# Patient Record
Sex: Female | Born: 1980 | Race: White | Hispanic: Yes | Marital: Married | Smoking: Never smoker
Health system: Southern US, Community
[De-identification: ages and names within clinical notes are randomized; demographics above are authoritative.]

## PROBLEM LIST (undated history)

## (undated) DIAGNOSIS — D179 Benign lipomatous neoplasm, unspecified: Secondary | ICD-10-CM

## (undated) DIAGNOSIS — I10 Essential (primary) hypertension: Secondary | ICD-10-CM

## (undated) DIAGNOSIS — R519 Headache, unspecified: Secondary | ICD-10-CM

## (undated) HISTORY — DX: Headache, unspecified: R51.9

---

## 2014-06-03 ENCOUNTER — Other Ambulatory Visit: Payer: Self-pay | Admitting: Family Medicine

## 2014-06-03 DIAGNOSIS — Z1239 Encounter for other screening for malignant neoplasm of breast: Secondary | ICD-10-CM

## 2016-02-26 ENCOUNTER — Other Ambulatory Visit: Payer: Self-pay | Admitting: Family Medicine

## 2016-02-26 ENCOUNTER — Other Ambulatory Visit (HOSPITAL_COMMUNITY)
Admission: RE | Admit: 2016-02-26 | Discharge: 2016-02-26 | Disposition: A | Discharge status: Home / Self Care | Payer: BLUE CROSS/BLUE SHIELD | Source: Ambulatory Visit | Attending: Family Medicine | Admitting: Family Medicine

## 2016-02-26 DIAGNOSIS — Z01411 Encounter for gynecological examination (general) (routine) with abnormal findings: Secondary | ICD-10-CM | POA: Insufficient documentation

## 2016-03-01 LAB — CYTOLOGY - PAP

## 2016-03-03 ENCOUNTER — Other Ambulatory Visit: Payer: Self-pay | Admitting: Family Medicine

## 2016-03-03 DIAGNOSIS — N644 Mastodynia: Secondary | ICD-10-CM

## 2016-03-10 ENCOUNTER — Other Ambulatory Visit: Payer: Self-pay | Admitting: Family Medicine

## 2016-03-10 DIAGNOSIS — N644 Mastodynia: Secondary | ICD-10-CM

## 2016-03-12 ENCOUNTER — Ambulatory Visit
Admission: RE | Admit: 2016-03-12 | Discharge: 2016-03-12 | Disposition: A | Discharge status: Home / Self Care | Payer: BLUE CROSS/BLUE SHIELD | Source: Ambulatory Visit | Attending: Family Medicine | Admitting: Family Medicine

## 2016-03-12 DIAGNOSIS — N644 Mastodynia: Secondary | ICD-10-CM

## 2019-03-23 ENCOUNTER — Other Ambulatory Visit: Payer: Self-pay | Admitting: Gastroenterology

## 2019-03-23 ENCOUNTER — Ambulatory Visit
Admission: RE | Admit: 2019-03-23 | Discharge: 2019-03-23 | Disposition: A | Discharge status: Home / Self Care | Payer: Medicaid Other | Source: Ambulatory Visit | Attending: Gastroenterology | Admitting: Gastroenterology

## 2019-03-23 DIAGNOSIS — R140 Abdominal distension (gaseous): Secondary | ICD-10-CM

## 2020-03-26 ENCOUNTER — Other Ambulatory Visit: Payer: Self-pay | Admitting: Family Medicine

## 2020-03-26 DIAGNOSIS — N644 Mastodynia: Secondary | ICD-10-CM

## 2020-03-28 ENCOUNTER — Telehealth: Payer: Self-pay | Admitting: Dermatology

## 2020-03-28 NOTE — Telephone Encounter (Signed)
Referral from [facility], Dr. Kateri Plummer, told her January and she wants you to contact practice + see if they can find something sooner

## 2020-04-10 ENCOUNTER — Other Ambulatory Visit: Payer: Self-pay | Admitting: Family Medicine

## 2020-04-10 ENCOUNTER — Ambulatory Visit
Admission: RE | Admit: 2020-04-10 | Discharge: 2020-04-10 | Disposition: A | Discharge status: Home / Self Care | Payer: Medicaid Other | Source: Ambulatory Visit | Attending: Family Medicine | Admitting: Family Medicine

## 2020-04-10 DIAGNOSIS — N644 Mastodynia: Secondary | ICD-10-CM

## 2020-04-10 DIAGNOSIS — N6320 Unspecified lump in the left breast, unspecified quadrant: Secondary | ICD-10-CM

## 2020-10-13 ENCOUNTER — Other Ambulatory Visit: Payer: Medicaid Other

## 2020-11-21 ENCOUNTER — Other Ambulatory Visit: Payer: Self-pay | Admitting: Family Medicine

## 2020-11-21 ENCOUNTER — Other Ambulatory Visit: Payer: Self-pay

## 2020-11-21 ENCOUNTER — Ambulatory Visit
Admission: RE | Admit: 2020-11-21 | Discharge: 2020-11-21 | Disposition: A | Discharge status: Home / Self Care | Payer: Medicaid Other | Source: Ambulatory Visit | Attending: Family Medicine | Admitting: Family Medicine

## 2020-11-21 DIAGNOSIS — N6320 Unspecified lump in the left breast, unspecified quadrant: Secondary | ICD-10-CM

## 2020-11-21 DIAGNOSIS — N6321 Unspecified lump in the left breast, upper outer quadrant: Secondary | ICD-10-CM | POA: Diagnosis not present

## 2020-11-25 ENCOUNTER — Other Ambulatory Visit: Payer: Self-pay

## 2020-11-25 ENCOUNTER — Ambulatory Visit
Admission: RE | Admit: 2020-11-25 | Discharge: 2020-11-25 | Disposition: A | Discharge status: Home / Self Care | Payer: BC Managed Care – PPO | Source: Ambulatory Visit | Attending: Family Medicine | Admitting: Family Medicine

## 2020-11-25 ENCOUNTER — Other Ambulatory Visit: Payer: BC Managed Care – PPO

## 2020-11-25 DIAGNOSIS — D242 Benign neoplasm of left breast: Secondary | ICD-10-CM | POA: Diagnosis not present

## 2020-11-25 DIAGNOSIS — N6320 Unspecified lump in the left breast, unspecified quadrant: Secondary | ICD-10-CM

## 2020-11-25 DIAGNOSIS — N6321 Unspecified lump in the left breast, upper outer quadrant: Secondary | ICD-10-CM | POA: Diagnosis not present

## 2020-12-29 ENCOUNTER — Other Ambulatory Visit: Payer: Self-pay | Admitting: Family Medicine

## 2020-12-29 DIAGNOSIS — Z1231 Encounter for screening mammogram for malignant neoplasm of breast: Secondary | ICD-10-CM

## 2021-04-13 ENCOUNTER — Ambulatory Visit: Payer: BC Managed Care – PPO

## 2021-06-05 ENCOUNTER — Ambulatory Visit
Admission: RE | Admit: 2021-06-05 | Discharge: 2021-06-05 | Disposition: A | Discharge status: Home / Self Care | Payer: Medicaid Other | Source: Ambulatory Visit | Attending: Family Medicine | Admitting: Family Medicine

## 2021-06-05 ENCOUNTER — Other Ambulatory Visit: Payer: Self-pay

## 2021-06-05 DIAGNOSIS — Z1231 Encounter for screening mammogram for malignant neoplasm of breast: Secondary | ICD-10-CM

## 2021-12-07 DIAGNOSIS — Z30431 Encounter for routine checking of intrauterine contraceptive device: Secondary | ICD-10-CM | POA: Diagnosis not present

## 2022-04-05 DIAGNOSIS — Z01419 Encounter for gynecological examination (general) (routine) without abnormal findings: Secondary | ICD-10-CM | POA: Diagnosis not present

## 2022-04-05 DIAGNOSIS — Z6837 Body mass index (BMI) 37.0-37.9, adult: Secondary | ICD-10-CM | POA: Diagnosis not present

## 2022-04-05 DIAGNOSIS — Z1151 Encounter for screening for human papillomavirus (HPV): Secondary | ICD-10-CM | POA: Diagnosis not present

## 2022-04-05 DIAGNOSIS — Z124 Encounter for screening for malignant neoplasm of cervix: Secondary | ICD-10-CM | POA: Diagnosis not present

## 2022-06-08 IMAGING — MG MM DIGITAL SCREENING BILAT W/ TOMO AND CAD
8 series · 8 of 24 positions shown · non-contrast
Comparison: Previous exam(s).

CLINICAL DATA: Screening.

EXAM:
DIGITAL SCREENING BILATERAL MAMMOGRAM WITH TOMOSYNTHESIS AND CAD
TECHNIQUE: Bilateral screening digital craniocaudal and mediolateral oblique
mammograms were obtained. Bilateral screening digital breast
tomosynthesis was performed. The images were evaluated with
computer-aided detection.

[R CC synth-2D]
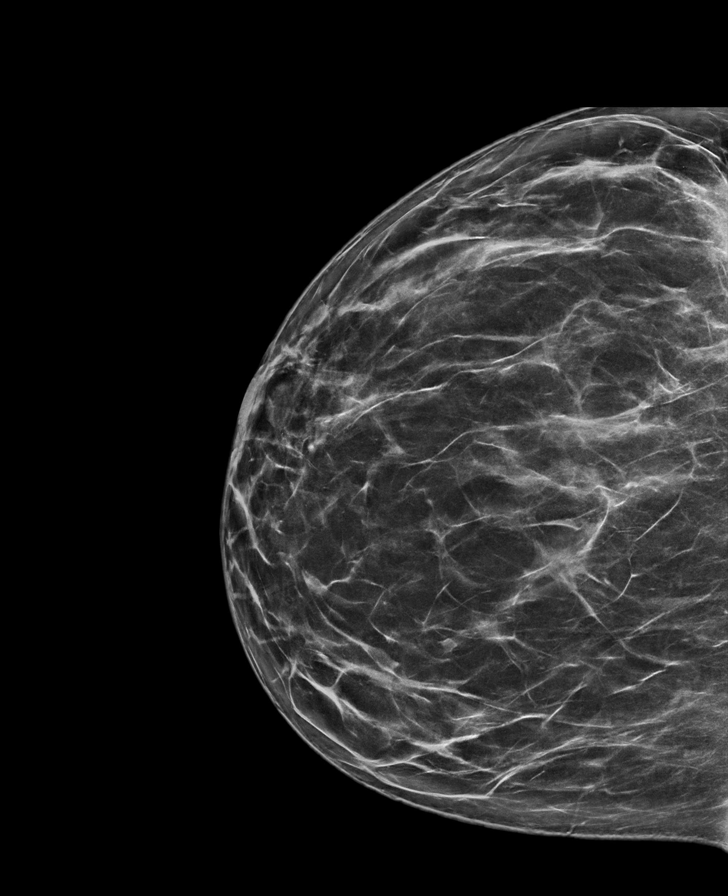

[L CC synth-2D]
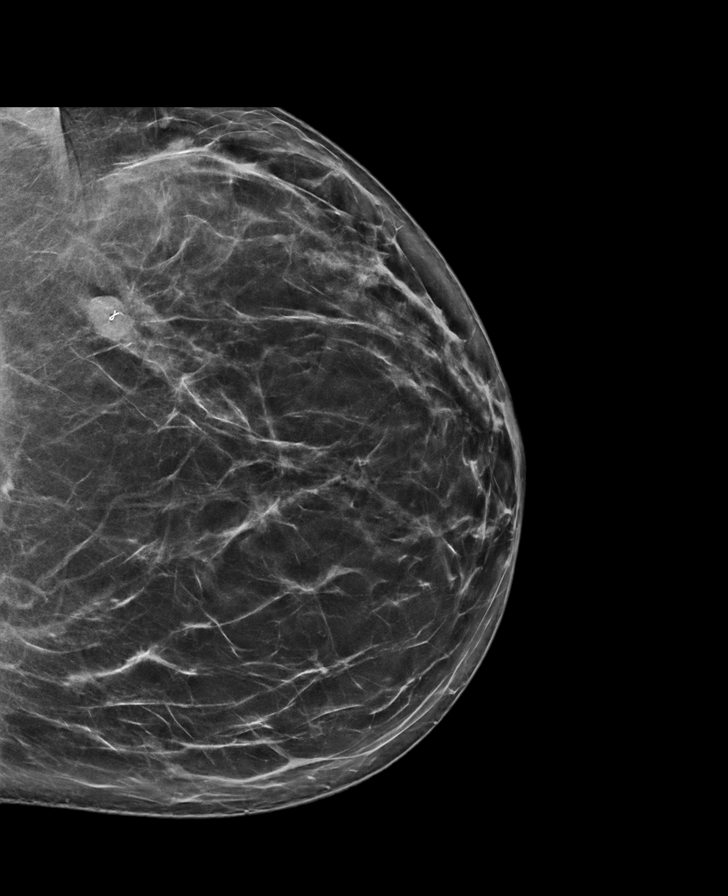

[L MLO synth-2D]
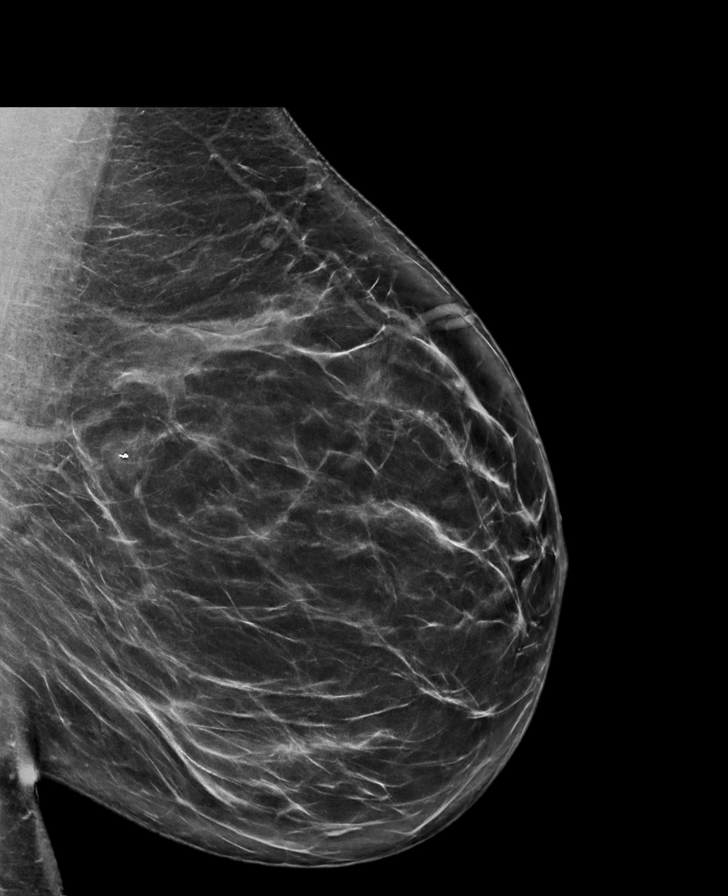

[R MLO synth-2D]
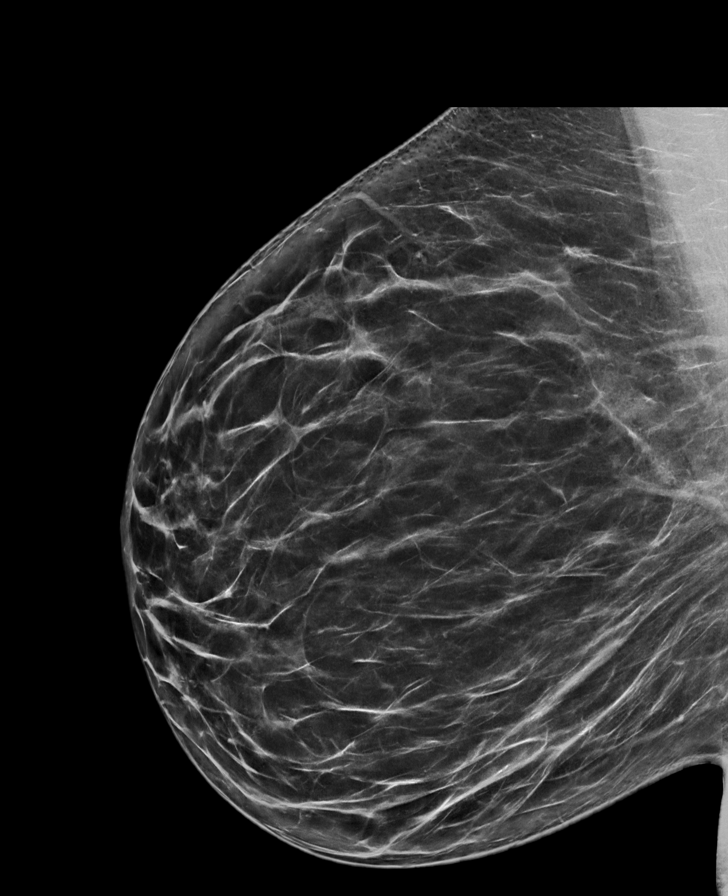

[R CC tomo · tomo slice 43/84.0]
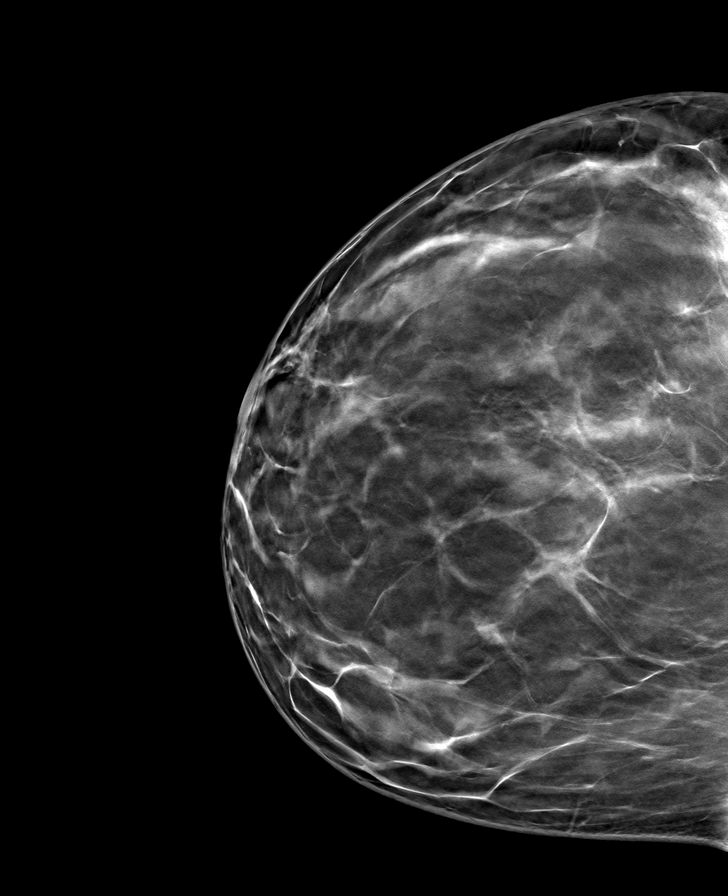

[L CC tomo · tomo slice 46/91.0]
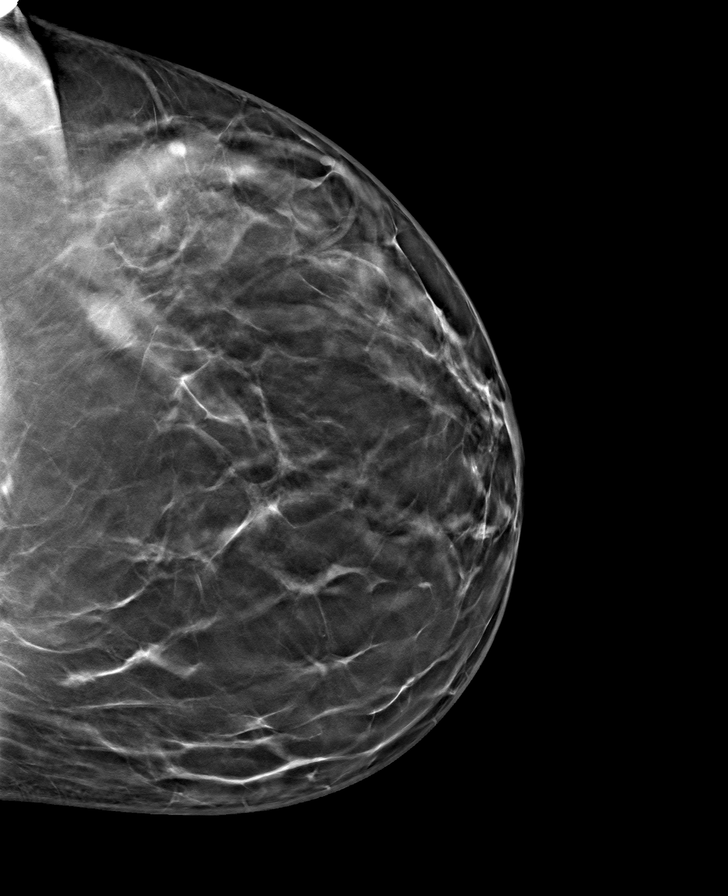

[R MLO tomo · tomo slice 47/94.0]
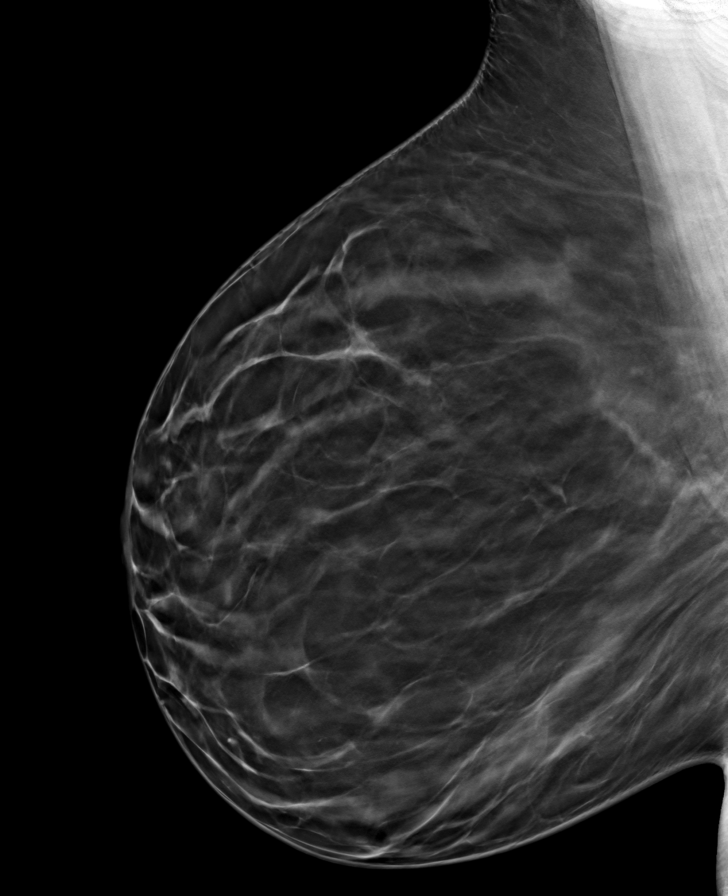

[L MLO tomo · tomo slice 49/97.0]
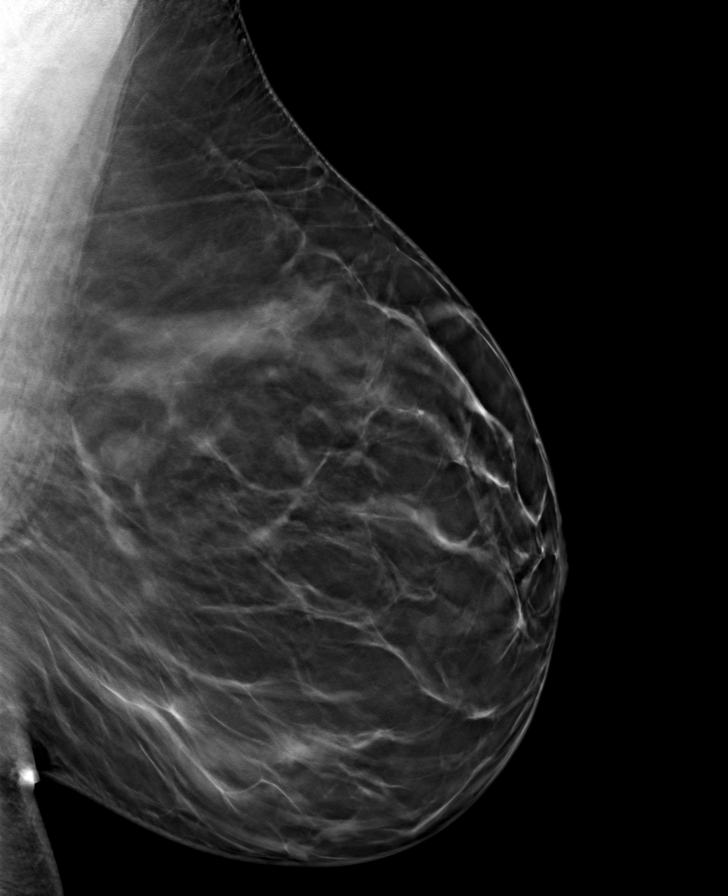

[8 of 24 positions shown; findings below may reference images not displayed]

ACR Breast Density Category b: There are scattered areas of
fibroglandular density.
FINDINGS: There are no findings suspicious for malignancy.
IMPRESSION: No mammographic evidence of malignancy. A result letter of this
screening mammogram will be mailed directly to the patient.

RECOMMENDATION:
Screening mammogram in one year. (Code:51-O-LD2)

BI-RADS CATEGORY  1: Negative.

## 2022-09-10 DIAGNOSIS — R3989 Other symptoms and signs involving the genitourinary system: Secondary | ICD-10-CM | POA: Diagnosis not present

## 2022-09-10 DIAGNOSIS — M542 Cervicalgia: Secondary | ICD-10-CM | POA: Diagnosis not present

## 2022-09-10 DIAGNOSIS — M799 Soft tissue disorder, unspecified: Secondary | ICD-10-CM | POA: Diagnosis not present

## 2022-09-13 ENCOUNTER — Other Ambulatory Visit: Payer: Self-pay | Admitting: Family Medicine

## 2022-09-13 DIAGNOSIS — M7989 Other specified soft tissue disorders: Secondary | ICD-10-CM

## 2022-09-29 ENCOUNTER — Ambulatory Visit
Admission: RE | Admit: 2022-09-29 | Discharge: 2022-09-29 | Disposition: A | Discharge status: Home / Self Care | Payer: BC Managed Care – PPO | Source: Ambulatory Visit | Attending: Family Medicine | Admitting: Family Medicine

## 2022-09-29 DIAGNOSIS — M7989 Other specified soft tissue disorders: Secondary | ICD-10-CM

## 2022-09-29 DIAGNOSIS — Z0389 Encounter for observation for other suspected diseases and conditions ruled out: Secondary | ICD-10-CM | POA: Diagnosis not present

## 2022-10-27 ENCOUNTER — Ambulatory Visit: Payer: Self-pay | Admitting: Surgery

## 2022-10-27 DIAGNOSIS — D171 Benign lipomatous neoplasm of skin and subcutaneous tissue of trunk: Secondary | ICD-10-CM | POA: Diagnosis not present

## 2022-10-27 NOTE — H&P (Signed)
History of Present Illness: Emily Kelly is a 42 y.o. female who is seen today as an office consultation for evaluation of New Consultation (Lipoma at abdominal wall)   About 5-6 years ago, she noticed a lump on her left flank. She says initially it did not bother her, so no further treatment was recommended. In the last few months she has felt like there is a pulling sensation at the site of the mass with certain movements and when she wakes up in the morning. She also sometimes has pain at the area that radiates to the back. She had an [place] on 09/29/22 that showed a 5cm lipoma on the abdominal wall. She was referred to discuss excision.   She does not take any blood thinners and has no known cardiopulmonary disease. She works as a Merchandiser, retail.      Review of Systems: A complete review of systems was obtained from the patient.  I have reviewed this information and discussed as appropriate with the patient.  See HPI as well for other ROS.     Medical History: Past Medical History Past Medical History: Diagnosis Date  Hypertension        There is no problem list on file for this patient.     Past Surgical History Past Surgical History: Procedure Laterality Date  CESAREAN SECTION          Allergies No Known Allergies    Current Outpatient Medications on File Prior to Visit Medication Sig Dispense Refill  levonorgestreL (MIRENA) IUD Provided by [place]      lisinopriL (ZESTRIL) 10 MG tablet Take 1 tablet by mouth once daily      methocarbamoL (ROBAXIN) 500 MG tablet TAKE 1 TO 2 TABLETS BY MOUTH AS NEEDED FOR MUSCLE PAIN AT BEDTIME       No current facility-administered medications on file prior to visit.     Family History Family History Problem Relation Age of Onset  Heart valve disease Mother    Diabetes Mother    Breast cancer Mother    Colon cancer Father        Social History   Tobacco Use Smoking Status Never Smokeless Tobacco Never     Social  History Social History    Socioeconomic History  Marital status: Married Tobacco Use  Smoking status: Never  Smokeless tobacco: Never Substance and Sexual Activity  Alcohol use: Not Currently  Drug use: Never      Objective:   Vitals:   10/27/22 1019 10/27/22 1024 BP: 122/74   Pulse: 73   Temp: 36.7 C (98.1 F)   SpO2: 98%   Weight: 93 kg (205 lb)   Height: 154.9 cm ('5\' 1"'$ )   PainSc:   0-No pain   Body mass index is 38.73 kg/m.   Physical Exam Vitals reviewed.  Constitutional:      General: She is not in acute distress.    Appearance: Normal appearance.  HENT:     Head: Normocephalic and atraumatic.  Eyes:     General: No scleral icterus.    Conjunctiva/sclera: Conjunctivae normal.  Cardiovascular:     Rate and Rhythm: Normal rate and regular rhythm.  Pulmonary:     Effort: Pulmonary effort is normal. No respiratory distress.     Breath sounds: Normal breath sounds.  Abdominal:     Comments: Subcutaneous mass on the left flank, well-circumscribed and freely mobile.  Musculoskeletal:        General: Normal range of motion.  Skin:  General: Skin is warm and dry.  Neurological:     General: No focal deficit present.     Mental Status: She is alert and oriented to person, place, and time.            Labs, Imaging and Diagnostic Testing: [place] 09/29/22: Targeted ultrasound was performed by the sonographer in the area of concern in the left abdomen. In the subcutaneous fat an oval circumscribed isoechoic mass measures 4.7 x 1.4 x 5.2 cm. This likely corresponds to the palpable area felt by the patient. No cystic mass is identified. No abdominal wall hernia is seen.     Assessment and Plan:    Diagnoses and all orders for this visit:   Lipoma of torso     Ms. Disalvo is a 42 yo female with a lipoma of the left flank. I personally reviewed her referral notes and imaging. I discussed that this is a benign lesion, however if symptomatic it can be removed  surgically. Lipomas are not typically painful, so I counseled that she may have underlying musculoskeletal pain that may not be relieved with lipoma excision. Regardless, the lipoma does bother her due to the location and she would like to proceed with excision. I reviewed the details of surgery and she agrees to proceed. She will be contacted to schedule an elective surgery date.  Michaelle Birks, MD [facility] General, Hepatobiliary and Pancreatic Surgery 10/27/22 11:13 AM

## 2022-11-08 ENCOUNTER — Other Ambulatory Visit: Payer: Self-pay | Admitting: Family Medicine

## 2022-11-08 DIAGNOSIS — Z1231 Encounter for screening mammogram for malignant neoplasm of breast: Secondary | ICD-10-CM

## 2022-11-11 ENCOUNTER — Ambulatory Visit
Admission: RE | Admit: 2022-11-11 | Discharge: 2022-11-11 | Disposition: A | Discharge status: Home / Self Care | Payer: BC Managed Care – PPO | Source: Ambulatory Visit | Attending: Family Medicine | Admitting: Family Medicine

## 2022-11-11 DIAGNOSIS — Z1231 Encounter for screening mammogram for malignant neoplasm of breast: Secondary | ICD-10-CM

## 2022-11-26 ENCOUNTER — Other Ambulatory Visit: Payer: Self-pay

## 2022-11-26 ENCOUNTER — Encounter (HOSPITAL_BASED_OUTPATIENT_CLINIC_OR_DEPARTMENT_OTHER): Payer: Self-pay | Admitting: Surgery

## 2022-11-30 ENCOUNTER — Encounter (HOSPITAL_BASED_OUTPATIENT_CLINIC_OR_DEPARTMENT_OTHER)
Admission: RE | Admit: 2022-11-30 | Discharge: 2022-11-30 | Disposition: A | Discharge status: Home / Self Care | Payer: BC Managed Care – PPO | Source: Ambulatory Visit | Attending: Surgery | Admitting: Surgery

## 2022-11-30 DIAGNOSIS — Z79899 Other long term (current) drug therapy: Secondary | ICD-10-CM | POA: Diagnosis not present

## 2022-11-30 DIAGNOSIS — Z01812 Encounter for preprocedural laboratory examination: Secondary | ICD-10-CM | POA: Insufficient documentation

## 2022-11-30 DIAGNOSIS — D171 Benign lipomatous neoplasm of skin and subcutaneous tissue of trunk: Secondary | ICD-10-CM | POA: Diagnosis not present

## 2022-11-30 DIAGNOSIS — I10 Essential (primary) hypertension: Secondary | ICD-10-CM | POA: Diagnosis not present

## 2022-11-30 DIAGNOSIS — Z6841 Body Mass Index (BMI) 40.0 and over, adult: Secondary | ICD-10-CM | POA: Diagnosis not present

## 2022-11-30 MED ORDER — CHLORHEXIDINE GLUCONATE CLOTH 2 % EX PADS: 6.0000 | MEDICATED_PAD | Freq: Once | CUTANEOUS | Status: DC

## 2022-11-30 NOTE — Progress Notes (Signed)

## 2022-12-03 ENCOUNTER — Other Ambulatory Visit: Payer: Self-pay

## 2022-12-03 ENCOUNTER — Ambulatory Visit (HOSPITAL_BASED_OUTPATIENT_CLINIC_OR_DEPARTMENT_OTHER): Payer: BC Managed Care – PPO | Admitting: Certified Registered"

## 2022-12-03 ENCOUNTER — Ambulatory Visit (HOSPITAL_BASED_OUTPATIENT_CLINIC_OR_DEPARTMENT_OTHER)
Admission: RE | Admit: 2022-12-03 | Discharge: 2022-12-03 | Disposition: A | Discharge status: Home / Self Care | Payer: BC Managed Care – PPO | Attending: Surgery | Admitting: Surgery

## 2022-12-03 ENCOUNTER — Encounter (HOSPITAL_BASED_OUTPATIENT_CLINIC_OR_DEPARTMENT_OTHER): Payer: Self-pay | Admitting: Surgery

## 2022-12-03 ENCOUNTER — Encounter (HOSPITAL_BASED_OUTPATIENT_CLINIC_OR_DEPARTMENT_OTHER)
Admission: RE | Disposition: A | Discharge status: Home / Self Care | Payer: Self-pay | Source: Home / Self Care | Attending: Surgery

## 2022-12-03 DIAGNOSIS — Z79899 Other long term (current) drug therapy: Secondary | ICD-10-CM | POA: Insufficient documentation

## 2022-12-03 DIAGNOSIS — I10 Essential (primary) hypertension: Secondary | ICD-10-CM | POA: Diagnosis not present

## 2022-12-03 DIAGNOSIS — D171 Benign lipomatous neoplasm of skin and subcutaneous tissue of trunk: Secondary | ICD-10-CM | POA: Diagnosis not present

## 2022-12-03 DIAGNOSIS — Z6841 Body Mass Index (BMI) 40.0 and over, adult: Secondary | ICD-10-CM | POA: Diagnosis not present

## 2022-12-03 DIAGNOSIS — Z01818 Encounter for other preprocedural examination: Secondary | ICD-10-CM

## 2022-12-03 HISTORY — PX: LIPOMA EXCISION: SHX5283

## 2022-12-03 HISTORY — DX: Essential (primary) hypertension: I10

## 2022-12-03 HISTORY — DX: Benign lipomatous neoplasm, unspecified: D17.9

## 2022-12-03 LAB — POCT PREGNANCY, URINE: Preg Test, Ur: NEGATIVE

## 2022-12-03 SURGERY — EXCISION LIPOMA
Anesthesia: General | Site: Flank | Laterality: Left

## 2022-12-03 MED ORDER — MIDAZOLAM HCL 2 MG/2ML IJ SOLN
INTRAMUSCULAR | Status: AC
  Filled 2022-12-03: qty 2

## 2022-12-03 MED ORDER — EPINEPHRINE PF 1 MG/ML IJ SOLN
INTRAMUSCULAR | Status: AC
  Filled 2022-12-03: qty 1

## 2022-12-03 MED ORDER — BUPIVACAINE-EPINEPHRINE (PF) 0.5% -1:200000 IJ SOLN
INTRAMUSCULAR | Status: DC | PRN
  Administered 2022-12-03: 30 mL

## 2022-12-03 MED ORDER — ACETAMINOPHEN 500 MG PO TABS
1000.0000 mg | ORAL_TABLET | ORAL | Status: AC
  Administered 2022-12-03: 1000 mg via ORAL

## 2022-12-03 MED ORDER — TRAMADOL HCL 50 MG PO TABS: 50.0000 mg | ORAL_TABLET | Freq: Four times a day (QID) | ORAL | 0 refills | Status: AC | PRN

## 2022-12-03 MED ORDER — CELECOXIB 200 MG PO CAPS
200.0000 mg | ORAL_CAPSULE | ORAL | Status: AC
  Administered 2022-12-03: 200 mg via ORAL

## 2022-12-03 MED ORDER — LIDOCAINE HCL (CARDIAC) PF 100 MG/5ML IV SOSY
PREFILLED_SYRINGE | INTRAVENOUS | Status: DC | PRN
  Administered 2022-12-03: 100 mg via INTRAVENOUS

## 2022-12-03 MED ORDER — KETOROLAC TROMETHAMINE 30 MG/ML IJ SOLN: 30.0000 mg | Freq: Once | INTRAMUSCULAR | Status: DC | PRN

## 2022-12-03 MED ORDER — OXYCODONE HCL 5 MG/5ML PO SOLN: 5.0000 mg | Freq: Once | ORAL | Status: DC | PRN

## 2022-12-03 MED ORDER — BUPIVACAINE HCL (PF) 0.25 % IJ SOLN
INTRAMUSCULAR | Status: AC
  Filled 2022-12-03: qty 30

## 2022-12-03 MED ORDER — FENTANYL CITRATE (PF) 100 MCG/2ML IJ SOLN
INTRAMUSCULAR | Status: DC | PRN
  Administered 2022-12-03: 50 ug via INTRAVENOUS

## 2022-12-03 MED ORDER — ONDANSETRON HCL 4 MG/2ML IJ SOLN: 4.0000 mg | Freq: Once | INTRAMUSCULAR | Status: DC | PRN

## 2022-12-03 MED ORDER — LACTATED RINGERS IV SOLN: INTRAVENOUS | Status: DC

## 2022-12-03 MED ORDER — OXYCODONE HCL 5 MG PO TABS: 5.0000 mg | ORAL_TABLET | Freq: Once | ORAL | Status: DC | PRN

## 2022-12-03 MED ORDER — PROPOFOL 10 MG/ML IV BOLUS
INTRAVENOUS | Status: DC | PRN
  Administered 2022-12-03: 200 mg via INTRAVENOUS

## 2022-12-03 MED ORDER — ONDANSETRON HCL 4 MG/2ML IJ SOLN
INTRAMUSCULAR | Status: DC | PRN
  Administered 2022-12-03: 4 mg via INTRAVENOUS

## 2022-12-03 MED ORDER — DEXAMETHASONE SODIUM PHOSPHATE 10 MG/ML IJ SOLN
INTRAMUSCULAR | Status: DC | PRN
  Administered 2022-12-03: 10 mg via INTRAVENOUS

## 2022-12-03 MED ORDER — FENTANYL CITRATE (PF) 100 MCG/2ML IJ SOLN: 25.0000 ug | INTRAMUSCULAR | Status: DC | PRN

## 2022-12-03 MED ORDER — GABAPENTIN 300 MG PO CAPS
ORAL_CAPSULE | ORAL | Status: AC
  Filled 2022-12-03: qty 1

## 2022-12-03 MED ORDER — MIDAZOLAM HCL 5 MG/5ML IJ SOLN
INTRAMUSCULAR | Status: DC | PRN
  Administered 2022-12-03: 2 mg via INTRAVENOUS

## 2022-12-03 MED ORDER — ACETAMINOPHEN 500 MG PO TABS
ORAL_TABLET | ORAL | Status: AC
  Filled 2022-12-03: qty 2

## 2022-12-03 MED ORDER — CEFAZOLIN SODIUM-DEXTROSE 2-4 GM/100ML-% IV SOLN
2.0000 g | INTRAVENOUS | Status: AC
  Administered 2022-12-03: 2 g via INTRAVENOUS

## 2022-12-03 MED ORDER — CELECOXIB 200 MG PO CAPS
ORAL_CAPSULE | ORAL | Status: AC
  Filled 2022-12-03: qty 1

## 2022-12-03 MED ORDER — BUPIVACAINE-EPINEPHRINE (PF) 0.25% -1:200000 IJ SOLN
INTRAMUSCULAR | Status: AC
  Filled 2022-12-03: qty 30

## 2022-12-03 MED ORDER — PROPOFOL 10 MG/ML IV BOLUS
INTRAVENOUS | Status: AC
  Filled 2022-12-03: qty 20

## 2022-12-03 MED ORDER — 0.9 % SODIUM CHLORIDE (POUR BTL) OPTIME
TOPICAL | Status: DC | PRN
  Administered 2022-12-03: 200 mL

## 2022-12-03 MED ORDER — CEFAZOLIN SODIUM-DEXTROSE 2-4 GM/100ML-% IV SOLN
INTRAVENOUS | Status: AC
  Filled 2022-12-03: qty 100

## 2022-12-03 MED ORDER — FENTANYL CITRATE (PF) 100 MCG/2ML IJ SOLN
INTRAMUSCULAR | Status: AC
  Filled 2022-12-03: qty 2

## 2022-12-03 SURGICAL SUPPLY — 40 items
ADH SKN CLS APL DERMABOND .7 (GAUZE/BANDAGES/DRESSINGS) ×1
APL PRP STRL LF DISP 70% ISPRP (MISCELLANEOUS) ×1
APL SKNCLS STERI-STRIP NONHPOA (GAUZE/BANDAGES/DRESSINGS)
BENZOIN TINCTURE PRP APPL 2/3 (GAUZE/BANDAGES/DRESSINGS) IMPLANT
BLADE SURG 15 STRL LF DISP TIS (BLADE) ×1 IMPLANT
BLADE SURG 15 STRL SS (BLADE) ×1
CANISTER SUCT 1200ML W/VALVE (MISCELLANEOUS) IMPLANT
CHLORAPREP W/TINT 26 (MISCELLANEOUS) ×1 IMPLANT
COVER BACK TABLE 60X90IN (DRAPES) ×1 IMPLANT
COVER MAYO STAND STRL (DRAPES) ×1 IMPLANT
DERMABOND ADVANCED .7 DNX12 (GAUZE/BANDAGES/DRESSINGS) IMPLANT
DRAPE LAPAROTOMY 100X72 PEDS (DRAPES) ×1 IMPLANT
DRAPE UTILITY XL STRL (DRAPES) ×1 IMPLANT
DRSG TEGADERM 2-3/8X2-3/4 SM (GAUZE/BANDAGES/DRESSINGS) IMPLANT
DRSG TEGADERM 4X4.75 (GAUZE/BANDAGES/DRESSINGS) IMPLANT
ELECT REM PT RETURN 9FT ADLT (ELECTROSURGICAL) ×1
ELECTRODE REM PT RTRN 9FT ADLT (ELECTROSURGICAL) ×1 IMPLANT
GAUZE SPONGE 4X4 12PLY STRL LF (GAUZE/BANDAGES/DRESSINGS) IMPLANT
GLOVE BIOGEL PI IND STRL 6 (GLOVE) ×1 IMPLANT
GLOVE BIOGEL PI MICRO STRL 5.5 (GLOVE) ×1 IMPLANT
GOWN STRL REUS W/ TWL LRG LVL3 (GOWN DISPOSABLE) ×2 IMPLANT
GOWN STRL REUS W/TWL LRG LVL3 (GOWN DISPOSABLE) ×2
NDL HYPO 25X1 1.5 SAFETY (NEEDLE) ×1 IMPLANT
NEEDLE HYPO 25X1 1.5 SAFETY (NEEDLE) ×1 IMPLANT
PACK BASIN DAY SURGERY FS (CUSTOM PROCEDURE TRAY) ×1 IMPLANT
PENCIL SMOKE EVACUATOR (MISCELLANEOUS) ×1 IMPLANT
SLEEVE SCD COMPRESS KNEE MED (STOCKING) ×1 IMPLANT
SPIKE FLUID TRANSFER (MISCELLANEOUS) IMPLANT
SPONGE T-LAP 4X18 ~~LOC~~+RFID (SPONGE) ×1 IMPLANT
STRIP CLOSURE SKIN 1/2X4 (GAUZE/BANDAGES/DRESSINGS) IMPLANT
SUT MON AB 4-0 PC3 18 (SUTURE) ×1 IMPLANT
SUT SILK 2 0 TIES 17X18 (SUTURE)
SUT SILK 2-0 18XBRD TIE BLK (SUTURE) IMPLANT
SUT VIC AB 3-0 SH 27 (SUTURE) ×1
SUT VIC AB 3-0 SH 27X BRD (SUTURE) ×1 IMPLANT
SUT VICRYL 3-0 CR8 SH (SUTURE) IMPLANT
SYR CONTROL 10ML LL (SYRINGE) ×1 IMPLANT
TOWEL GREEN STERILE FF (TOWEL DISPOSABLE) ×1 IMPLANT
TUBE CONNECTING 20X1/4 (TUBING) IMPLANT
YANKAUER SUCT BULB TIP NO VENT (SUCTIONS) IMPLANT

## 2022-12-03 NOTE — Discharge Instructions (Addendum)
[facility] DISCHARGE INSTRUCTIONS  Activity Ok to shower in 24 hours, but do not bathe or submerge incisions underwater. Do not drive while taking narcotic pain medication.  Wound Care Your incisions are covered with skin glue called Dermabond. This will peel off on its own over time. You may shower and allow warm soapy water to run over your incisions. Gently pat dry. Do not submerge your incision underwater for 2 weeks. Monitor your incision for any new redness, tenderness, or drainage.  When to Call us: Fever greater than 100.5 New redness, drainage, or swelling at incision site Severe pain, nausea, or vomiting  Follow-up You have an appointment scheduled with Dr. Freida Busman on Dec 28, 2022 at 10:20am. This will be at the [facility] Surgery office at [address], [address], [place], [place]. Please arrive at least 15 minutes prior to your scheduled appointment time.  For questions or concerns, please call the office at [phone].  No Tylenol until 12:30 No ibuprofen until 2:30pm   Post Anesthesia Home Care Instructions  Activity: Get plenty of rest for the remainder of the day. A responsible individual must stay with you for 24 hours following the procedure.  For the next 24 hours, DO NOT: -Drive a car -Advertising copywriter -Drink alcoholic beverages -Take any medication unless instructed by your physician -Make any legal decisions or sign important papers.  Meals: Start with liquid foods such as gelatin or soup. Progress to regular foods as tolerated. Avoid greasy, spicy, heavy foods. If nausea and/or vomiting occur, drink only clear liquids until the nausea and/or vomiting subsides. Call your physician if vomiting continues.  Special Instructions/Symptoms: Your throat may feel dry or sore from the anesthesia or the breathing tube placed in your throat during surgery. If this causes discomfort, gargle with warm salt water. The discomfort should  disappear within 24 hours.  If you had a scopolamine patch placed behind your ear for the management of post- operative nausea and/or vomiting:  1. The medication in the patch is effective for 72 hours, after which it should be removed.  Wrap patch in a tissue and discard in the trash. Wash hands thoroughly with soap and water. 2. You may remove the patch earlier than 72 hours if you experience unpleasant side effects which may include dry mouth, dizziness or visual disturbances. 3. Avoid touching the patch. Wash your hands with soap and water after contact with the patch.

## 2022-12-03 NOTE — Anesthesia Preprocedure Evaluation (Signed)
Anesthesia Evaluation  Patient identified by MRN, date of birth, ID band Patient awake    Reviewed: Allergy & Precautions, H&P , NPO status , Patient's Chart, lab work & pertinent test results  Airway Mallampati: II  TM Distance: <3 FB Neck ROM: Full    Dental no notable dental hx.    Pulmonary neg pulmonary ROS   Pulmonary exam normal breath sounds clear to auscultation       Cardiovascular hypertension, Pt. on medications Normal cardiovascular exam Rhythm:Regular Rate:Normal     Neuro/Psych negative neurological ROS  negative psych ROS   GI/Hepatic negative GI ROS, Neg liver ROS,,,  Endo/Other    Morbid obesity  Renal/GU negative Renal ROS  negative genitourinary   Musculoskeletal negative musculoskeletal ROS (+)    Abdominal   Peds negative pediatric ROS (+)  Hematology negative hematology ROS (+)   Anesthesia Other Findings   Reproductive/Obstetrics negative OB ROS                             Anesthesia Physical Anesthesia Plan  ASA: 2  Anesthesia Plan: General   Post-op Pain Management: Minimal or no pain anticipated   Induction: Intravenous  PONV Risk Score and Plan: 3 and Ondansetron, Dexamethasone and Treatment may vary due to age or medical condition  Airway Management Planned: LMA  Additional Equipment:   Intra-op Plan:   Post-operative Plan: Extubation in OR  Informed Consent: I have reviewed the patients History and Physical, chart, labs and discussed the procedure including the risks, benefits and alternatives for the proposed anesthesia with the patient or authorized representative who has indicated his/her understanding and acceptance.     Dental advisory given  Plan Discussed with: CRNA  Anesthesia Plan Comments: (ETT if prone)       Anesthesia Quick Evaluation

## 2022-12-03 NOTE — H&P (Signed)
Emily Kelly is an 42 y.o. female.   Chief Complaint: lipoma HPI: Emily Kelly is a 42 yo female who was referred with a lipoma. About 5-6 years ago, she noticed a lump on her left flank. She says initially it did not bother her, so no further treatment was recommended. In the last few months she has felt like there is a pulling sensation at the site of the mass with certain movements and when she wakes up in the morning. She also sometimes has pain at the area that radiates to the back. She had an [place] on 09/29/22 that showed a 5cm lipoma on the abdominal wall. She is here today for surgical excision.   Past Medical History:  Diagnosis Date   Hypertension    Lipoma     Past Surgical History:  Procedure Laterality Date   CESAREAN SECTION      Family History  Problem Relation Age of Onset   Breast cancer Mother 26   Breast cancer Maternal Grandmother    Social History:  reports that she has never smoked. She has never used smokeless tobacco. She reports that she does not drink alcohol and does not use drugs.  Allergies: No Known Allergies  Medications Prior to Admission  Medication Sig Dispense Refill   lisinopril (ZESTRIL) 10 MG tablet Take 10 mg by mouth daily.     methocarbamol (ROBAXIN) 500 MG tablet Take 500 mg by mouth 4 (four) times daily.     levonorgestrel (MIRENA) 20 MCG/DAY IUD 1 each by Intrauterine route once.      Results for orders placed or performed during the hospital encounter of 12/03/22 (from the past 48 hour(s))  Pregnancy, urine POC     Status: None   Collection Time: 12/03/22  6:31 AM  Result Value Ref Range   Preg Test, Ur NEGATIVE NEGATIVE    Comment:        THE SENSITIVITY OF THIS METHODOLOGY IS >24 mIU/mL    No results found.  Review of Systems  Blood pressure (!) 137/92, pulse 73, temperature 98.3 F (36.8 C), temperature source Oral, resp. rate 16, height  (1.549 m), weight 96 kg, SpO2 100 %. Physical Exam Vitals reviewed.  Constitutional:       General: She is not in acute distress.    Appearance: Normal appearance.  HENT:     Head: Normocephalic and atraumatic.  Pulmonary:     Effort: Pulmonary effort is normal. No respiratory distress.  Abdominal:     General: There is no distension.     Palpations: Abdomen is soft.     Comments: Well-circumscribed mobile subcutaneous mass on the left flank, about 5-6cm in largest diameter.  Musculoskeletal:        General: Normal range of motion.  Skin:    General: Skin is warm and dry.  Neurological:     General: No focal deficit present.     Mental Status: She is alert and oriented to person, place, and time.      Assessment/Plan 42 yo female with a benign lipoma of the left flank. She has elected to proceed with excision. The procedure details, anticipated recovery and postoperative restrictions were reviewed. Informed consent obtained. Surgical site confirmed and marked. All questions answered.   Fritzi Mandes, MD 12/03/2022, 7:17 AM

## 2022-12-03 NOTE — Anesthesia Procedure Notes (Signed)
Procedure Name: LMA Insertion Date/Time: 12/03/2022 7:32 AM  Performed by: Lauralyn Primes, CRNAPre-anesthesia Checklist: Patient identified, Emergency Drugs available, Suction available and Patient being monitored Patient Re-evaluated:Patient Re-evaluated prior to induction Oxygen Delivery Method: Circle system utilized Preoxygenation: Pre-oxygenation with 100% oxygen Induction Type: IV induction Ventilation: Mask ventilation without difficulty LMA: LMA inserted LMA Size: 4.0 Number of attempts: 1 Airway Equipment and Method: Bite block Placement Confirmation: positive ETCO2 Tube secured with: Tape Dental Injury: Teeth and Oropharynx as per pre-operative assessment

## 2022-12-03 NOTE — Anesthesia Postprocedure Evaluation (Signed)
Anesthesia Post Note  Patient: Brookelle Pellicane  Procedure(s) Performed: LIPOMA EXCISION LEFT FLANK (Left: Flank)     Patient location during evaluation: PACU Anesthesia Type: General Level of consciousness: awake and alert Pain management: pain level controlled Vital Signs Assessment: post-procedure vital signs reviewed and stable Respiratory status: spontaneous breathing, nonlabored ventilation, respiratory function stable and patient connected to nasal cannula oxygen Cardiovascular status: blood pressure returned to baseline and stable Postop Assessment: no apparent nausea or vomiting Anesthetic complications: no  No notable events documented.  Last Vitals:  Vitals:   12/03/22 0816 12/03/22 0830  BP: 123/77 128/74  Pulse: 70 63  Resp: 13 12  Temp: (!) 36.3 C   SpO2: 100% 100%    Last Pain:  Vitals:   12/03/22 0830  TempSrc:   PainSc: 0-No pain                 Dontavis Tschantz S

## 2022-12-03 NOTE — Op Note (Signed)
Date: 12/03/22  Patient: Emily Kelly MRN: [id number]  Preoperative Diagnosis: Lipoma of left flank Postoperative Diagnosis: Same  Procedure: Excision of left flank lipoma  Surgeon: Sophronia Simas, MD  EBL: Minimal  Anesthesia: General LMA  Specimens: Left flank lipoma  Indications: [name] is a 42 yo female who presented with a subcutaneous mass on the left side. An ultrasound was consistent with a benign lipoma. The lesion was symptomatic, and after a discussion of the risks and benefits of surgery, she elected to proceed with excision.  Findings: Well-circumscribed fatty mass on the left flank measuring 8x5x2cm, consistent with a benign lipoma.  Procedure details: Informed consent was obtained in the preoperative area prior to the procedure. The patient was brought to the operating room and placed on the table in the supine position. General anesthesia was induced and a bump was placed under the left side. Perioperative antibiotics were administered per SCIP guidelines. The left flank was prepped and draped in the usual sterile fashion. A pre-procedure timeout was taken verifying patient identity, surgical site and procedure to be performed.  A transverse skin incision was made overlying the palpable left flank mass. The subcutaneous tissue and Scarpa's fascia were divided with cautery. Just deep to Scarpa's layer, a well-circumscribed fatty mass was identified. This was dissected out of the surrounding tissue using blunt dissection and cautery. The deep aspect was adjacent to the abdominal wall fascia. It was taken off the fascia using cautery. The specimen was excised and measured, and sent for routine pathology. An additional small lobule of tissue was identified in the posterior aspect of the wound, and this was excised with cautery and sent with the specimen. The wound was irrigated and hemostasis was achieved with cautery. Scarpa's layer was closed with interrupted 3-0 Vicryl sutures.  The deep dermis was closed with interrupted 3-0 Vicryl, and the skin was closed with running subcuticular 4-0 monocryl suture. Dermabond was applied.  The patient tolerated the procedure well with no apparent complications. All counts were correct x2 at the end of the procedure. The patient was extubated and taken to PACU in stable condition.  Sophronia Simas, MD 12/03/22 8:08 AM

## 2022-12-03 NOTE — Transfer of Care (Signed)
Immediate Anesthesia Transfer of Care Note  Patient: Malana Eberwein  Procedure(s) Performed: LIPOMA EXCISION LEFT FLANK (Left: Flank)  Patient Location: PACU  Anesthesia Type:General  Level of Consciousness: drowsy  Airway & Oxygen Therapy: Patient Spontanous Breathing and Patient connected to face mask oxygen  Post-op Assessment: Report given to RN and Post -op Vital signs reviewed and stable  Post vital signs: Reviewed and stable  Last Vitals:  Vitals Value Taken Time  BP 123/77 12/03/22 0816  Temp 36.3 C 12/03/22 0816  Pulse 67 12/03/22 0819  Resp 12 12/03/22 0819  SpO2 100 % 12/03/22 0819  Vitals shown include unvalidated device data.  Last Pain:  Vitals:   12/03/22 0816  TempSrc:   PainSc: Asleep         Complications: No notable events documented.

## 2022-12-06 ENCOUNTER — Encounter (HOSPITAL_BASED_OUTPATIENT_CLINIC_OR_DEPARTMENT_OTHER): Payer: Self-pay | Admitting: Surgery

## 2022-12-06 LAB — SURGICAL PATHOLOGY

## 2022-12-13 DIAGNOSIS — M25569 Pain in unspecified knee: Secondary | ICD-10-CM | POA: Diagnosis not present

## 2022-12-21 ENCOUNTER — Other Ambulatory Visit (HOSPITAL_COMMUNITY): Payer: Self-pay | Admitting: Family Medicine

## 2022-12-21 DIAGNOSIS — M25561 Pain in right knee: Secondary | ICD-10-CM

## 2022-12-22 ENCOUNTER — Ambulatory Visit (HOSPITAL_COMMUNITY)
Admission: RE | Admit: 2022-12-22 | Discharge: 2022-12-22 | Disposition: A | Discharge status: Home / Self Care | Payer: BC Managed Care – PPO | Source: Ambulatory Visit | Attending: Family Medicine | Admitting: Family Medicine

## 2022-12-22 DIAGNOSIS — M25561 Pain in right knee: Secondary | ICD-10-CM | POA: Diagnosis not present

## 2022-12-22 NOTE — Progress Notes (Signed)
Right lower extremity venous duplex has been completed. Preliminary results can be found in CV Proc through chart review.  Results were given to [facility] at Dr. Vincente Liberty office.  12/22/22 10:28 AM Olen Cordial RVT

## 2022-12-28 DIAGNOSIS — M25561 Pain in right knee: Secondary | ICD-10-CM | POA: Diagnosis not present

## 2023-10-10 DIAGNOSIS — R519 Headache, unspecified: Secondary | ICD-10-CM | POA: Diagnosis not present

## 2023-10-10 DIAGNOSIS — I10 Essential (primary) hypertension: Secondary | ICD-10-CM | POA: Diagnosis not present

## 2023-10-10 DIAGNOSIS — M542 Cervicalgia: Secondary | ICD-10-CM | POA: Diagnosis not present

## 2023-10-14 ENCOUNTER — Other Ambulatory Visit: Payer: Self-pay | Admitting: Family Medicine

## 2023-10-14 DIAGNOSIS — R519 Headache, unspecified: Secondary | ICD-10-CM

## 2023-10-19 DIAGNOSIS — R051 Acute cough: Secondary | ICD-10-CM | POA: Diagnosis not present

## 2023-10-19 DIAGNOSIS — R6883 Chills (without fever): Secondary | ICD-10-CM | POA: Diagnosis not present

## 2023-10-19 DIAGNOSIS — R52 Pain, unspecified: Secondary | ICD-10-CM | POA: Diagnosis not present

## 2023-10-19 DIAGNOSIS — H6090 Unspecified otitis externa, unspecified ear: Secondary | ICD-10-CM | POA: Diagnosis not present

## 2023-10-19 DIAGNOSIS — J101 Influenza due to other identified influenza virus with other respiratory manifestations: Secondary | ICD-10-CM | POA: Diagnosis not present

## 2023-10-27 ENCOUNTER — Other Ambulatory Visit: Payer: BC Managed Care – PPO

## 2023-11-25 ENCOUNTER — Other Ambulatory Visit: Payer: Self-pay | Admitting: Family Medicine

## 2023-11-25 DIAGNOSIS — Z1231 Encounter for screening mammogram for malignant neoplasm of breast: Secondary | ICD-10-CM

## 2023-12-01 ENCOUNTER — Ambulatory Visit
Admission: RE | Admit: 2023-12-01 | Discharge: 2023-12-01 | Disposition: A | Discharge status: Home / Self Care | Source: Ambulatory Visit | Attending: Family Medicine | Admitting: Family Medicine

## 2023-12-01 DIAGNOSIS — Z1231 Encounter for screening mammogram for malignant neoplasm of breast: Secondary | ICD-10-CM

## 2024-01-27 ENCOUNTER — Ambulatory Visit (INDEPENDENT_AMBULATORY_CARE_PROVIDER_SITE_OTHER): Payer: Self-pay | Admitting: Neurology

## 2024-01-27 ENCOUNTER — Encounter: Payer: Self-pay | Admitting: Neurology

## 2024-01-27 VITALS — BP 144/108 | HR 63 | Ht 61.0 in | Wt 217.0 lb

## 2024-01-27 DIAGNOSIS — R51 Headache with orthostatic component, not elsewhere classified: Secondary | ICD-10-CM

## 2024-01-27 DIAGNOSIS — G43009 Migraine without aura, not intractable, without status migrainosus: Secondary | ICD-10-CM | POA: Insufficient documentation

## 2024-01-27 DIAGNOSIS — R519 Headache, unspecified: Secondary | ICD-10-CM

## 2024-01-27 MED ORDER — SUMATRIPTAN SUCCINATE 100 MG PO TABS: 100.0000 mg | ORAL_TABLET | Freq: Once | ORAL | 12 refills | Status: AC | PRN

## 2024-01-27 NOTE — Patient Instructions (Addendum)
 Recommend MRI brain when possible Routine blood work (cbc, cmp, tsh) - reports were normal Consider exploring sleep apnea  Migraine management:  Prevention: recommend Topamax, will hold off and prescribe acute at this time  Acute: Triptan class. Try Imitrex and if ineffective then Rizatriptan, also the new medications ubrelvy and nurtec.   Sumatriptan Tablets What is this medication? SUMATRIPTAN (soo ma TRIP tan) treats migraines. It works by blocking pain signals and narrowing blood vessels in the brain. It belongs to a group of medications called triptans. It is not used to prevent migraines. This medicine may be used for other purposes; ask your health care provider or pharmacist if you have questions. COMMON BRAND NAME(S): Imitrex, Migraine Pack What should I tell my care team before I take this medication? They need to know if you have any of these conditions: Circulation problems in fingers and toes Diabetes Heart disease High blood pressure High cholesterol History of irregular heartbeat History of stroke Kidney disease Liver disease Stomach or intestine problems Tobacco use An unusual or allergic reaction to sumatriptan, other medications, foods, dyes, or preservatives Pregnant or trying to get pregnant Breastfeeding How should I use this medication? Take this medication by mouth with a glass of water. Follow the directions on the prescription label. Do not take it more often than directed. Talk to your care team about the use of this medication in children. Special care may be needed. Overdosage: If you think you have taken too much of this medicine contact a poison control center or emergency room at once. NOTE: This medicine is only for you. Do not share this medicine with others. What if I miss a dose? This does not apply. This medication is not for regular use. What may interact with this medication? Do not take this medication with any of the following: Certain  medications for migraine headache, such as almotriptan, eletriptan, frovatriptan, naratriptan, rizatriptan, sumatriptan, zolmitriptan Ergot alkaloids, such as dihydroergotamine, ergonovine, ergotamine, methylergonovine MAOIs, such as Carbex, Eldepryl, Marplan, Nardil, and Parnate This medication may also interact with the following: Certain medications for mental health conditions This list may not describe all possible interactions. Give your health care provider a list of all the medicines, herbs, non-prescription drugs, or dietary supplements you use. Also tell them if you smoke, drink alcohol, or use illegal drugs. Some items may interact with your medicine. What should I watch for while using this medication? Visit your care team for regular checks on your progress. Tell your care team if your symptoms do not start to get better or if they get worse. This medication may affect your coordination, reaction time, or judgment. Do not drive or operate machinery until you know how this medication affects you. Sit up or stand slowly to reduce the risk of dizzy or fainting spells. Drinking alcohol with this medication can increase the risk of these side effects. Tell your care team right away if you have any change in your eyesight. If you take migraine medications for 10 or more days a month, your migraines may get worse. Keep a diary of headache days and medication use. Contact your care team if your migraine attacks occur more frequently. What side effects may I notice from receiving this medication? Side effects that you should report to your care team as soon as possible: Allergic reactions--skin rash, itching, hives, swelling of the face, lips, tongue, or throat Burning, pain, tingling, or color changes in the legs or feet Heart attack--pain or tightness in the chest,  shoulders, arms, or jaw, nausea, shortness of breath, cold or clammy skin, feeling faint or lightheaded Heart rhythm changes--fast  or irregular heartbeat, dizziness, feeling faint or lightheaded, chest pain, trouble breathing Increase in blood pressure Raynaud's--cool, numb, or painful fingers or toes that may change color from pale, to blue, to red Seizures Serotonin syndrome--irritability, confusion, fast or irregular heartbeat, muscle stiffness, twitching muscles, sweating, high fever, seizure, chills, vomiting, diarrhea Stroke--sudden numbness or weakness of the face, arm, or leg, trouble speaking, confusion, trouble walking, loss of balance or coordination, dizziness, severe headache, change in vision Sudden or severe stomach pain, nausea, vomiting, fever, or bloody diarrhea Vision loss Side effects that usually do not require medical attention (report to your care team if they continue or are bothersome): Dizziness General discomfort or fatigue This list may not describe all possible side effects. Call your doctor for medical advice about side effects. You may report side effects to FDA at 1-800-FDA-1088. Where should I keep my medication? Keep out of the reach of children and pets. Store at room temperature between 2 and 30 degrees C (36 and 86 degrees F). Throw away any unused medication after the expiration date. NOTE: This sheet is a summary. It may not cover all possible information. If you have questions about this medicine, talk to your doctor, pharmacist, or health care provider.  2024 Elsevier/Gold Standard (2022-06-08 00:00:00)

## 2024-01-27 NOTE — Progress Notes (Unsigned)
 [facility]    Provider:  Dr Tresia Fruit Requesting Provider: Ronna Coho, MD Primary Care Provider:  Ronna Coho, MD  CC:  Migraines  HPI:  Emily Kelly is a 43 y.o. female here as requested by Ronna Coho, MD for migraines.  Past medical history primary thrombocytopenia, chronic low back pain without sciatica, migraine without aura and without status migrainosus not intractable.  Per Dr. Octavia Belton notes, last seen October 10, 2023, patient's headaches have been going on for an extended time but are getting worse, used to be just bothersome at night, at times the pain is severe will she wake up in pain brings her to tears, she is getting some daytime headaches as well, the muscle relaxer that has helped made her too sleepy to take during the daytime, she takes Tylenol  but limited to an average of once a week, headaches seem more constant of late, more bothersome at night, posterior but at times will radiate anteriorly, the neck will have discomfort rotating to the right.  Appears MRI of the brain was ordered October 14, 2023 I do not see results.  Here with her husband who provides information. 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, she has headaches occ but worsening, since she started having more stress she believes it trigger stress, massive headaches in the middle of the night, husband has a cpap so doesn;t kow if she snores, she states she snores when laying on her back, mostly when head it down she snores, during the day happening as well triggers such heat, worse is supine when in the middle of the night, usually tired in the morning, the pain changes most of the time it is in the back of the scalp and on the sides of the forehead, helps to apply pressure to the occipital area, as soon as she takes pressure eoff it comes back, cold helps. Worsening since 2021 about 4 years, stopped for a while when she quit her job but acutely worsening,  she may go a few months where she has a lot of headaches and some months not as bad. Some months she does not have any, winter is worse when the heater is on. Caffeine like a soda she may have a headache. Throbbing, light sensitivity with the headache, a dark room helps, nausea, vomiting, hurts to move, the pain can be so bad she has nausea and vomiting, can last 24 hours untreated, tylenol  will help. She did not get the MRI completed, she has a deductible, I highly recommend she gets it completed. No vision changes. She has been to an eye doctor. Mother has headaches. Stable, worsening acutely on and off. Not napping during the day or nodding off. No other focal neurologic deficits, associated symptoms, inciting events or modifiable factors She checks her blood pressure when this occurs and it is normal, she is on a low dose of lisinopril. On average she has 6 migraine days a month and < 10 total headache days a month. No aura. No other focal neurologic deficits, associated symptoms, inciting events or modifiable factors.    From a thorough review of records and patient report, Medications tried that can be used in migraine/headache management greater than 3 months include: Lifestyle modification, headache diaries, better sleep hygiene, exercise, management of migraine triggers, OTC and prescribed analgesics/nsaids such as ibuprofen, excedrin, alleve and others, lisinopril, robaxin(made it worse), zofran .   Reviewed notes, labs and imaging from outside physicians, which showed:  MRI brain 10/14/2023  ordered by Dr. Maryrose Soja and pending  Review of Systems: Patient complains of symptoms per HPI as well as the following symptoms low backpain, stress. Pertinent negatives and positives per HPI. All others negative.   Social History   Socioeconomic History   Marital status: Married    Spouse name: Not on file   Number of children: Not on file   Years of education: Not on file   Highest education level:  Not on file  Occupational History   Not on file  Tobacco Use   Smoking status: Never   Smokeless tobacco: Never  Vaping Use   Vaping status: Never Used  Substance and Sexual Activity   Alcohol use: Never   Drug use: Never   Sexual activity: Not on file  Other Topics Concern   Not on file  Social History Narrative   Not on file   Social Drivers of Health   Financial Resource Strain: Not on file  Food Insecurity: Not on file  Transportation Needs: Not on file  Physical Activity: Not on file  Stress: Not on file  Social Connections: Not on file  Intimate Partner Violence: Not on file    Family History  Problem Relation Age of Onset   Breast cancer Mother 31   Colon cancer Father    Pancreatic cancer Maternal Grandmother    Breast cancer Maternal Grandmother    Heart disease Paternal Grandmother    Prostate cancer Paternal Grandfather     Past Medical History:  Diagnosis Date   Headache    Hypertension    Lipoma     Patient Active Problem List   Diagnosis Date Noted   Migraine without aura and without status migrainosus, not intractable 01/27/2024    Past Surgical History:  Procedure Laterality Date   CESAREAN SECTION     LIPOMA EXCISION Left 12/03/2022   Procedure: LIPOMA EXCISION LEFT FLANK;  Surgeon: Lujean Sake, MD;  Location: [facility];  Service: General;  Laterality: Left;    Current Outpatient Medications  Medication Sig Dispense Refill   Acetaminophen  (TYLENOL ) 325 MG CAPS Take 1-2 tablets by mouth as needed (headache).     levonorgestrel (MIRENA) 20 MCG/DAY IUD 1 each by Intrauterine route once.     lisinopril (ZESTRIL) 10 MG tablet Take 10 mg by mouth daily.     SUMAtriptan (IMITREX) 100 MG tablet Take 1 tablet (100 mg total) by mouth once as needed for up to 1 dose. May repeat in 2 hours if headache persists or recurs. 10 tablet 12   No current facility-administered medications for this visit.    Allergies as of 01/27/2024    (No Known Allergies)    Vitals: BP (!) 144/108 Comment: has not taken BP meds  Pulse 63   Ht 5' 1 (1.549 m)   Wt 217 lb (98.4 kg)   BMI 41.00 kg/m  Last Weight:  Wt Readings from Last 1 Encounters:  01/27/24 217 lb (98.4 kg)   Last Height:   Ht Readings from Last 1 Encounters:  01/27/24 5' 1 (1.549 m)     Physical exam: Exam: Gen: NAD, conversant, well nourised, obese, well groomed                     CV: RRR, no MRG. No Carotid Bruits. No peripheral edema, warm, nontender Eyes: Conjunctivae clear without exudates or hemorrhage  Neuro: Detailed Neurologic Exam  Speech:    Speech is normal; fluent and spontaneous with normal  comprehension.  Cognition:    The patient is oriented to person, place, and time;     recent and remote memory intact;     language fluent;     normal attention, concentration,     fund of knowledge Cranial Nerves:    The pupils are equal, round, and reactive to light. The fundi are normal and spontaneous venous pulsations are present. Visual fields are full to finger confrontation. Extraocular movements are intact. Trigeminal sensation is intact and the muscles of mastication are normal. The face is symmetric. The palate elevates in the midline. Hearing intact. Voice is normal. Shoulder shrug is normal. The tongue has normal motion without fasciculations.   Coordination: nml  Gait: nml  Motor Observation:    No asymmetry, no atrophy, and no involuntary movements noted. Tone:    Normal muscle tone.    Posture:    Posture is normal. normal erect    Strength:    Strength is V/V in the upper and lower limbs.      Sensation: intact to LT     Reflex Exam:  DTR's:    Deep tendon reflexes in the upper and lower extremities are normal bilaterally.   Toes:    The toes are downgoing bilaterally.   Clonus:    Clonus is absent.    Assessment/Plan:  Patient with migraines  Recommend MRI brain when possible: MRI brain  Was ordered and  pending, encouraged her to get it completed.  Routine blood work (cbc, cmp, tsh) - reports were normal Consider exploring sleep apnea  Migraine management:  Prevention: recommend Topamax, will hold off and prescribe acute at this time per patient wishes  Acute: Triptan class. Try Imitrex and if ineffective then Rizatriptan, also the new medications ubrelvy and nurtec.   Also spent time education of migraines and migraine management, medication choices, acute vs prevention, strategies as well as below To prevent or relieve headaches, try the following: Cool Compress. Lie down and place a cool compress on your head.  Avoid headache triggers. If certain foods or odors seem to have triggered your migraines in the past, avoid them. A headache diary might help you identify triggers.  Include physical activity in your daily routine. Try a daily walk or other moderate aerobic exercise.  Manage stress. Find healthy ways to cope with the stressors, such as delegating tasks on your to-do list.  Practice relaxation techniques. Try deep breathing, yoga, massage and visualization.  Eat regularly. Eating regularly scheduled meals and maintaining a healthy diet might help prevent headaches. Also, drink plenty of fluids.  Follow a regular sleep schedule. Sleep deprivation might contribute to headaches Consider biofeedback. With this mind-body technique, you learn to control certain bodily functions -- such as muscle tension, heart rate and blood pressure -- to prevent headaches or reduce headache pain.    Proceed to emergency room if you experience new or worsening symptoms or symptoms do not resolve, if you have new neurologic symptoms or if headache is severe, or for any concerning symptom.   Provided education and documentation including informational packet on: migraines, symptoms as well as aura aura, what causes migraines, migraine triggers, chronic migraine medication overuse headache, chronic  migraines, prevention of migraines both acute and preventative and the different choices and classes, nondrug treatments, behavioral and other nonpharmacologic treatments for headache.   No orders of the defined types were placed in this encounter.  Meds ordered this encounter  Medications   SUMAtriptan (IMITREX) 100 MG tablet  Sig: Take 1 tablet (100 mg total) by mouth once as needed for up to 1 dose. May repeat in 2 hours if headache persists or recurs.    Dispense:  10 tablet    Refill:  12    Cc: Ronna Coho, MD,  Ronna Coho, MD  Aldona Amel, MD  [facility] [address] [place], [place] [address]  Phone [phone] Fax [phone]  I spent 35 minutes of face-to-face and non-face-to-face time with patient on the  1. Migraine without aura and without status migrainosus, not intractable   2. Worsening headaches   3. Nocturnal headaches   4. Positional headache    diagnosis.  This included previsit chart review, lab review, study review, order entry, electronic health record documentation, patient education on the different diagnostic and therapeutic options, counseling and coordination of care, risks and benefits of management, compliance, or risk factor reduction

## 2024-01-30 ENCOUNTER — Emergency Department (HOSPITAL_BASED_OUTPATIENT_CLINIC_OR_DEPARTMENT_OTHER)
Admission: EM | Admit: 2024-01-30 | Discharge: 2024-01-31 | Disposition: A | Discharge status: Home / Self Care | Attending: Emergency Medicine | Admitting: Emergency Medicine

## 2024-01-30 ENCOUNTER — Emergency Department (HOSPITAL_BASED_OUTPATIENT_CLINIC_OR_DEPARTMENT_OTHER): Admitting: Radiology

## 2024-01-30 ENCOUNTER — Other Ambulatory Visit: Payer: Self-pay

## 2024-01-30 DIAGNOSIS — I10 Essential (primary) hypertension: Secondary | ICD-10-CM | POA: Insufficient documentation

## 2024-01-30 DIAGNOSIS — M5442 Lumbago with sciatica, left side: Secondary | ICD-10-CM | POA: Diagnosis not present

## 2024-01-30 DIAGNOSIS — M545 Low back pain, unspecified: Secondary | ICD-10-CM | POA: Diagnosis not present

## 2024-01-30 MED ORDER — OXYCODONE-ACETAMINOPHEN 5-325 MG PO TABS
1.0000 | ORAL_TABLET | ORAL | Status: DC | PRN
  Administered 2024-01-30: 1 via ORAL
  Filled 2024-01-30: qty 1

## 2024-01-30 MED ORDER — ONDANSETRON 4 MG PO TBDP: 4.0000 mg | ORAL_TABLET | Freq: Once | ORAL | Status: DC

## 2024-01-30 MED ORDER — ACETAMINOPHEN 325 MG PO TABS: 650.0000 mg | ORAL_TABLET | Freq: Four times a day (QID) | ORAL | Status: DC | PRN

## 2024-01-30 MED ORDER — IBUPROFEN 400 MG PO TABS: 400.0000 mg | ORAL_TABLET | Freq: Once | ORAL | Status: DC | PRN

## 2024-01-31 MED ORDER — NAPROXEN 500 MG PO TABS: 500.0000 mg | ORAL_TABLET | Freq: Two times a day (BID) | ORAL | 0 refills | Status: AC

## 2024-01-31 MED ORDER — LIDOCAINE 5 % EX PTCH: 1.0000 | MEDICATED_PATCH | CUTANEOUS | 0 refills | Status: AC

## 2024-01-31 MED ORDER — METHOCARBAMOL 500 MG PO TABS
500.0000 mg | ORAL_TABLET | Freq: Three times a day (TID) | ORAL | 0 refills | Status: AC | PRN
Start: 2024-01-31 — End: ?

## 2024-01-31 NOTE — ED Provider Notes (Signed)
 [facility] Emergency Department Provider Note MRN:  [id number]  Arrival date & time: 01/31/24     Chief Complaint   Back pain History of Present Illness   Emily Kelly is a 43 y.o. year-old female with no pertinent past medical history presenting to the ED with chief complaint of back pain.  Strained her back while changing a tire yesterday, worse today.  No numbness or weakness to the arms or legs, no bowel or bladder dysfunction, no fever.  Worse on the left side lower back with some radiation to the left leg.  Review of Systems  A thorough review of systems was obtained and all systems are negative except as noted in the HPI and PMH.   Patient's Health History    Past Medical History:  Diagnosis Date   Headache    Hypertension    Lipoma     Past Surgical History:  Procedure Laterality Date   CESAREAN SECTION     LIPOMA EXCISION Left 12/03/2022   Procedure: LIPOMA EXCISION LEFT FLANK;  Surgeon: Lujean Sake, MD;  Location: [facility];  Service: General;  Laterality: Left;    Family History  Problem Relation Age of Onset   Breast cancer Mother 68   Colon cancer Father    Pancreatic cancer Maternal Grandmother    Breast cancer Maternal Grandmother    Heart disease Paternal Grandmother    Prostate cancer Paternal Grandfather     Social History   Socioeconomic History   Marital status: Married    Spouse name: Not on file   Number of children: Not on file   Years of education: Not on file   Highest education level: Not on file  Occupational History   Not on file  Tobacco Use   Smoking status: Never   Smokeless tobacco: Never  Vaping Use   Vaping status: Never Used  Substance and Sexual Activity   Alcohol use: Never   Drug use: Never   Sexual activity: Not on file  Other Topics Concern   Not on file  Social History Narrative   Not on file   Social Drivers of Health   Financial Resource Strain: Not on file  Food  Insecurity: Not on file  Transportation Needs: Not on file  Physical Activity: Not on file  Stress: Not on file  Social Connections: Not on file  Intimate Partner Violence: Not on file     Physical Exam   Vitals:   01/30/24 2148  BP: (!) 133/96  Pulse: 68  Resp: 20  Temp: 98 F (36.7 C)  SpO2: 99%    CONSTITUTIONAL: Well-appearing, NAD NEURO/PSYCH:  Alert and oriented x 3, no focal deficits EYES:  eyes equal and reactive ENT/NECK:  no LAD, no JVD CARDIO: Regular rate, well-perfused, normal S1 and S2 PULM:  CTAB no wheezing or rhonchi GI/GU:  non-distended, non-tender MSK/SPINE:  No gross deformities, no edema SKIN:  no rash, atraumatic   *Additional and/or pertinent findings included in MDM below  Diagnostic and Interventional Summary    EKG Interpretation Date/Time:    Ventricular Rate:    PR Interval:    QRS Duration:    QT Interval:    QTC Calculation:   R Axis:      Text Interpretation:         Labs Reviewed - No data to display  DG Lumbar Spine Complete  Final Result      Medications  acetaminophen  (TYLENOL ) tablet 650 mg (has  no administration in time range)  ibuprofen (ADVIL) tablet 400 mg (has no administration in time range)  oxyCODONE -acetaminophen  (PERCOCET/ROXICET) 5-325 MG per tablet 1 tablet (1 tablet Oral Given 01/30/24 2154)  ondansetron  (ZOFRAN -ODT) disintegrating tablet 4 mg (has no administration in time range)     Procedures  /  Critical Care Procedures  ED Course and Medical Decision Making  Initial Impression and Ddx Favoring muscle strain or spasm versus herniated disc.  Patient feeling a lot better after medications given in triage.  Normal and symmetric strength to the legs, no signs or symptoms of myelopathy.  Past medical/surgical history that increases complexity of ED encounter: None  Interpretation of Diagnostics I personally reviewed the lumbar x-ray and my interpretation is as follows: No fracture or bony  abnormality    Patient Reassessment and Ultimate Disposition/Management     Discharge  Patient management required discussion with the following services or consulting groups:  None  Complexity of Problems Addressed Acute complicated illness or Injury  Additional Data Reviewed and Analyzed Further history obtained from: Further history from spouse/family member  Additional Factors Impacting ED Encounter Risk Prescriptions  Merrick Abe. Harless Lien, MD [facility] [email or web address]  Final Clinical Impressions(s) / ED Diagnoses     ICD-10-CM   1. Acute left-sided low back pain with left-sided sciatica  M54.42       ED Discharge Orders          Ordered    naproxen (NAPROSYN) 500 MG tablet  2 times daily        01/31/24 0018    methocarbamol (ROBAXIN) 500 MG tablet  Every 8 hours PRN        01/31/24 0018    lidocaine  (LIDODERM ) 5 %  Every 24 hours        01/31/24 0018             Discharge Instructions Discussed with and Provided to Patient:    Discharge Instructions      You were evaluated in the Emergency Department and after careful evaluation, we did not find any emergent condition requiring admission or further testing in the hospital.  Your exam/testing today is overall reassuring.  Symptoms likely due to muscle strain or herniated disc.  Recommend using the Naprosyn twice daily as prescribed for pain.  Can use the Robaxin muscle relaxer for more significant pain, best used at night if you are having trouble sleeping as it can cause drowsiness.  Can also use the numbing patches provided.  Recommend rest for the next few days.  Please return to the Emergency Department if you experience any worsening of your condition.   Thank you for allowing us  to be a part of your care.      Edson Graces, MD 01/31/24 [phone]

## 2024-01-31 NOTE — ED Notes (Signed)
 RN reviewed discharge instructions with pt. Pt verbalized understanding and had no further questions. VSS upon discharge.

## 2024-01-31 NOTE — Discharge Instructions (Signed)
 You were evaluated in the Emergency Department and after careful evaluation, we did not find any emergent condition requiring admission or further testing in the hospital.  Your exam/testing today is overall reassuring.  Symptoms likely due to muscle strain or herniated disc.  Recommend using the Naprosyn twice daily as prescribed for pain.  Can use the Robaxin muscle relaxer for more significant pain, best used at night if you are having trouble sleeping as it can cause drowsiness.  Can also use the numbing patches provided.  Recommend rest for the next few days.  Please return to the Emergency Department if you experience any worsening of your condition.   Thank you for allowing us  to be a part of your care.

## 2024-04-12 DIAGNOSIS — Z800 Family history of malignant neoplasm of digestive organs: Secondary | ICD-10-CM | POA: Diagnosis not present

## 2024-04-12 DIAGNOSIS — Z1211 Encounter for screening for malignant neoplasm of colon: Secondary | ICD-10-CM | POA: Diagnosis not present

## 2024-04-12 DIAGNOSIS — D122 Benign neoplasm of ascending colon: Secondary | ICD-10-CM | POA: Diagnosis not present

## 2024-07-20 ENCOUNTER — Ambulatory Visit: Admitting: Adult Health
